# Patient Record
Sex: Female | Born: 1995 | Race: Black or African American | Hispanic: No | Marital: Single
Health system: Southern US, Community
[De-identification: ages and names within clinical notes are randomized; demographics above are authoritative.]

## PROBLEM LIST (undated history)

## (undated) ENCOUNTER — Emergency Department (HOSPITAL_COMMUNITY): Admission: EM | Payer: Self-pay | Source: Home / Self Care

## (undated) DIAGNOSIS — B009 Herpesviral infection, unspecified: Secondary | ICD-10-CM

## (undated) DIAGNOSIS — A549 Gonococcal infection, unspecified: Secondary | ICD-10-CM

## (undated) HISTORY — DX: Herpesviral infection, unspecified: B00.9

## (undated) HISTORY — DX: Gonococcal infection, unspecified: A54.9

---
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
## 2017-03-12 ENCOUNTER — Emergency Department (HOSPITAL_COMMUNITY): Payer: Medicaid Other

## 2017-03-12 ENCOUNTER — Encounter (HOSPITAL_COMMUNITY): Payer: Self-pay | Admitting: Emergency Medicine

## 2017-03-12 ENCOUNTER — Emergency Department (HOSPITAL_COMMUNITY)
Admission: EM | Admit: 2017-03-12 | Discharge: 2017-03-12 | Disposition: A | Discharge status: Home / Self Care | Payer: Medicaid Other | Attending: Emergency Medicine | Admitting: Emergency Medicine

## 2017-03-12 DIAGNOSIS — R918 Other nonspecific abnormal finding of lung field: Secondary | ICD-10-CM | POA: Insufficient documentation

## 2017-03-12 DIAGNOSIS — R079 Chest pain, unspecified: Secondary | ICD-10-CM | POA: Diagnosis present

## 2017-03-12 DIAGNOSIS — R059 Cough, unspecified: Secondary | ICD-10-CM

## 2017-03-12 DIAGNOSIS — R05 Cough: Secondary | ICD-10-CM

## 2017-03-12 DIAGNOSIS — R0789 Other chest pain: Secondary | ICD-10-CM | POA: Diagnosis not present

## 2017-03-12 DIAGNOSIS — R9389 Abnormal findings on diagnostic imaging of other specified body structures: Secondary | ICD-10-CM

## 2017-03-12 MED ORDER — BENZONATATE 100 MG PO CAPS: 100.0000 mg | ORAL_CAPSULE | Freq: Three times a day (TID) | ORAL | 0 refills | Status: AC | PRN

## 2017-03-12 MED ORDER — IBUPROFEN 400 MG PO TABS
600.0000 mg | ORAL_TABLET | Freq: Once | ORAL | Status: AC
  Administered 2017-03-12: 600 mg via ORAL
  Filled 2017-03-12: qty 1

## 2017-03-12 NOTE — Discharge Instructions (Signed)
You have a chest x-ray with a nodule over your upper left chest.  Please follow up in three months for a repeat CXR or additional testing as deemed necessary by your primary care provider.  Please call the number listed in this paperwork to obtain a primary care provider.

## 2017-03-12 NOTE — ED Provider Notes (Signed)
[facility]EMERGENCY DEPT Provider Note   CSN: [id number] Arrival date & time: 03/12/17  0807     History   Chief Complaint Chief Complaint  Patient presents with  . Chest Pain    HPI Deborah Duncan is a 21 y.o. female. She presents with approximately 2 weeks of worsening cough, congestion and for the past few days has been experiencing right sided lower anterior "rib pain".  She reports that her pain is currently a 10 out of 10, increases with cough, deep breathing. Reports that she has been occasionally taking ibuprofen with her last dose this week and, she reports that taking 200 MG ibuprofen does not help with her pain.  She reports occasional headache, one episode of diarrhea this morning.  Denies history of asthma, shortness of breath, nausea, vomiting.    HPI  Past Medical History:  Diagnosis Date  . Gonorrhea   . HSV-2 (herpes simplex virus 2) infection     There are no active problems to display for this patient.   History reviewed. No pertinent surgical history.  OB History    No data available       Home Medications    Prior to Admission medications   Medication Sig Start Date End Date Taking? Authorizing Provider  benzonatate (TESSALON) 100 MG capsule Take 1 capsule (100 mg total) by mouth 3 (three) times daily as needed for cough. 03/12/17 03/22/17  Nira Conn, MD  diphenhydrAMINE (BENADRYL) 25 MG tablet Take 1 tablet (25 mg total) by mouth every 4 (four) hours as needed for itching. 05/20/15   Zadie Rhine, MD  EPINEPHrine (EPIPEN 2-PAK) 0.3 mg/0.3 mL IJ SOAJ injection Inject 0.3 mLs (0.3 mg total) into the muscle once. Patient not taking: Reported on 05/23/2015 05/20/15   Zadie Rhine, MD  medroxyPROGESTERone (DEPO-PROVERA) 150 MG/ML injection Inject 150 mg into the muscle every 3 (three) months.    Historical Provider, MD    Family History Family History  Problem Relation Age of Onset  . Hypertension Mother     Social History Social History    Substance Use Topics  . Smoking status: Never Smoker  . Smokeless tobacco: Never Used  . Alcohol use No     Allergies   Acyclovir and related   Review of Systems Review of Systems  Constitutional: Negative for chills, fatigue and fever.  HENT: Positive for sore throat. Negative for ear pain, trouble swallowing and voice change.   Eyes: Negative for pain and visual disturbance.  Respiratory: Positive for cough. Negative for chest tightness and shortness of breath.   Cardiovascular: Negative for chest pain and palpitations.  Gastrointestinal: Positive for diarrhea (One episode this morning). Negative for abdominal pain, nausea and vomiting.  Genitourinary: Negative for dysuria and hematuria.  Musculoskeletal: Positive for arthralgias and myalgias. Negative for back pain.  Skin: Negative for color change and rash.  Neurological: Negative for dizziness, seizures and syncope.  All other systems reviewed and are negative.    Physical Exam Updated Vital Signs BP 119/78   Pulse 86   Temp 98.2 F (36.8 C)   Resp 18   SpO2 98%   Physical Exam  Constitutional: She appears well-developed and well-nourished. No distress.  HENT:  Head: Normocephalic and atraumatic.  Right Ear: External ear normal.  Left Ear: External ear normal.  Mouth/Throat: Oropharynx is clear and moist. No oropharyngeal exudate.  Eyes: Conjunctivae are normal. Right eye exhibits no discharge. Left eye exhibits no discharge. No scleral icterus.  Neck: Normal range of  motion. Neck supple. No tracheal deviation present.  Cardiovascular: Normal rate, regular rhythm and normal heart sounds.   Pulmonary/Chest: Effort normal and breath sounds normal. No stridor. No respiratory distress. She has no wheezes. She has no rales. She exhibits tenderness (Right anterior lower ribs). She exhibits no mass and no deformity. Right breast exhibits no inverted nipple, no mass, no nipple discharge, no skin change and no tenderness.  Left breast exhibits no inverted nipple, no mass, no nipple discharge, no skin change and no tenderness. Breasts are symmetrical.    Skin is with out nevus consistent with nodule on CXR  Abdominal: Soft. She exhibits no distension.  Musculoskeletal: She exhibits no edema or deformity.  Lymphadenopathy:    She has no cervical adenopathy.  Neurological: She is alert. She exhibits normal muscle tone.  Skin: Skin is warm and dry. She is not diaphoretic.  Psychiatric: She has a normal mood and affect. Her behavior is normal.  Nursing note and vitals reviewed.    ED Treatments / Results  Labs (all labs ordered are listed, but only abnormal results are displayed) Labs Reviewed - No data to display  EKG  EKG Interpretation None       Radiology Dg Chest 2 View  Result Date: 03/12/2017 CLINICAL DATA:  Cough and chest pain EXAM: CHEST  2 VIEW COMPARISON:  None. FINDINGS: There is a nodular opacity either in or overlying the left upper lobe measuring 1.4 x 1.3 cm. Lungs elsewhere clear. Heart size and pulmonary vascularity are normal. No adenopathy. No bone lesions. No pneumothorax. IMPRESSION: Nodular opacity either in or overlying the left upper lobe. Advise clinical assessment for possible skin nevus as the cause of this opacity. If an overlying structure is not seen, it would be prudent to obtain a follow-up chest radiograph in 3 months to assess for stability. Alternatively, noncontrast enhanced chest CT could be helpful for further assessment in this regard. Lungs elsewhere clear. No adenopathy. Electronically Signed   By: Bretta Bang III M.D.   On: 03/12/2017 09:11    Procedures Procedures (including critical care time)  Medications Ordered in ED Medications  ibuprofen (ADVIL,MOTRIN) tablet 600 mg (600 mg Oral Given 03/12/17 0915)     Initial Impression / Assessment and Plan / ED Course  I have reviewed the triage vital signs and the nursing notes.  Pertinent labs & imaging  results that were available during my care of the patient were reviewed by me and considered in my medical decision making (see chart for details).    Pt CXR negative for acute infiltrate. Based on nodule found on CXR a complete breast exam was performed, with chaperone, that was unremarkable.  Patients symptoms are consistent with URI, likely viral etiology. Discussed that antibiotics are not indicated for viral infections. Pt will be discharged with symptomatic treatment and tessalon for cough.  Verbalizes understanding and is agreeable with plan. Pt is hemodynamically stable & in NAD prior to dc.  Patient was informed of the incidental finding of nodule on CXR.  Patient was advised to Obtain a primary care doctor.  Patient was advised of radiologist's recommendations and to obtain a repeat chest x-ray in 3 months or as directed by her primary care.  Patient was given the opportunity to ask questions all of which were answered to her satisfaction.    Final Clinical Impressions(s) / ED Diagnoses   Final diagnoses:  Chest wall pain  Abnormal finding on chest xray  Cough    New Prescriptions  Discharge Medication List as of 03/12/2017 10:20 AM    START taking these medications   Details  benzonatate (TESSALON) 100 MG capsule Take 1 capsule (100 mg total) by mouth 3 (three) times daily as needed for cough., Starting Tue 03/12/2017, Until Fri 03/22/2017, Print         Cristina Gong, PA-C 03/12/17 1104    Canary Brim Tegeler, MD 03/12/17 [phone]

## 2017-03-12 NOTE — ED Notes (Signed)
Patient returned from xray.

## 2017-03-12 NOTE — ED Provider Notes (Signed)
Medical screening examination/treatment/procedure(s) were conducted as a shared visit with non-physician practitioner(s) and myself.  I personally evaluated the patient during the encounter. Briefly, the patient is a 21 y.o. female  with no significant past medical history presents with 2 weeks of URI symptoms. No fevers. Lungs clear auscultation bilaterally. Chest x-ray without evidence of pneumonia. We'll treat symptomatically with Tessalon Perles. Safe for discharge with strict return precautions.   EKG Interpretation None           Nira Conn, MD 03/12/17 1721

## 2017-03-12 NOTE — ED Notes (Signed)
c/o pain under her right rib area, states her allergies are really bad. c/o occ cough. Pain is worse with movement and inspiration.

## 2017-03-12 NOTE — ED Triage Notes (Signed)
Pt reports hx of seasonal allergies and coughing, c/o right rib pain since last weekened. Denies any injury, resp e/u.

## 2018-09-01 ENCOUNTER — Ambulatory Visit (HOSPITAL_COMMUNITY): Admission: EM | Admit: 2018-09-01 | Discharge: 2018-09-01 | Discharge status: Against Medical Advice | Payer: Self-pay

## 2021-05-07 ENCOUNTER — Encounter (HOSPITAL_COMMUNITY): Payer: Self-pay

## 2021-05-07 ENCOUNTER — Emergency Department (HOSPITAL_COMMUNITY)
Admission: EM | Admit: 2021-05-07 | Discharge: 2021-05-07 | Disposition: A | Discharge status: Home / Self Care | Payer: Self-pay | Attending: Emergency Medicine | Admitting: Emergency Medicine

## 2021-05-07 ENCOUNTER — Other Ambulatory Visit: Payer: Self-pay

## 2021-05-07 DIAGNOSIS — B9689 Other specified bacterial agents as the cause of diseases classified elsewhere: Secondary | ICD-10-CM | POA: Insufficient documentation

## 2021-05-07 DIAGNOSIS — N760 Acute vaginitis: Secondary | ICD-10-CM | POA: Insufficient documentation

## 2021-05-07 LAB — WET PREP, GENITAL
Sperm: NONE SEEN
Trich, Wet Prep: NONE SEEN
Yeast Wet Prep HPF POC: NONE SEEN

## 2021-05-07 LAB — HIV ANTIBODY (ROUTINE TESTING W REFLEX): HIV Screen 4th Generation wRfx: NONREACTIVE

## 2021-05-07 MED ORDER — METRONIDAZOLE 500 MG PO TABS: 500.0000 mg | ORAL_TABLET | Freq: Two times a day (BID) | ORAL | 0 refills | Status: DC

## 2021-05-07 NOTE — ED Provider Notes (Signed)
[facility]-EMERGENCY DEPT Provider Note   CSN: [id number] Arrival date & time: 05/07/21  1208     History No chief complaint on file.   Deborah Duncan is a 25 y.o. female.  The history is provided by the patient. No language interpreter was used.   25 year old female significant history of herpes infection, gonorrhea, who presents with concern for abnormal vaginal changes.  Patient states this morning sexual partner noticed some abnormal changes to her vaginal region and she would like to be evaluated.  She denies any abdominal pain vaginal pain vaginal bleeding vaginal discharge or any irritation.  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  No fever or dysuria.  Past Medical History:  Diagnosis Date   Gonorrhea    HSV-2 (herpes simplex virus 2) infection     There are no problems to display for this patient.   History reviewed. No pertinent surgical history.   OB History   No obstetric history on file.     Family History  Problem Relation Age of Onset   Hypertension Mother     Social History   Tobacco Use   Smoking status: Never   Smokeless tobacco: Never  Vaping Use   Vaping Use: Never used  Substance Use Topics   Alcohol use: No   Drug use: No    Home Medications Prior to Admission medications   Medication Sig Start Date End Date Taking? Authorizing Provider  diphenhydrAMINE (BENADRYL) 25 MG tablet Take 1 tablet (25 mg total) by mouth every 4 (four) hours as needed for itching. 05/20/15   Zadie Rhine, MD  EPINEPHrine (EPIPEN 2-PAK) 0.3 mg/0.3 mL IJ SOAJ injection Inject 0.3 mLs (0.3 mg total) into the muscle once. Patient not taking: Reported on 05/23/2015 05/20/15   Zadie Rhine, MD  medroxyPROGESTERone (DEPO-PROVERA) 150 MG/ML injection Inject 150 mg into the muscle every 3 (three) months.    [provider]    Allergies    Acyclovir and related  Review of Systems    Review of Systems  All other systems reviewed and are negative.  Physical Exam Updated Vital Signs BP 135/78 (BP Location: Left Arm)   Pulse 84   Temp 99 F (37.2 C) (Oral)   Resp 18   Ht 5\' 9"  (1.753 m)   Wt 61.6 kg   LMP 04/06/2021 (Approximate)   SpO2 100%   BMI 20.04 kg/m   Physical Exam Vitals and nursing note reviewed.  Constitutional:      General: She is not in acute distress.    Appearance: She is well-developed.  HENT:     Head: Atraumatic.  Eyes:     Conjunctiva/sclera: Conjunctivae normal.  Pulmonary:     Effort: Pulmonary effort is normal.  Abdominal:     Palpations: Abdomen is soft.     Tenderness: There is no abdominal tenderness.  Musculoskeletal:     Cervical back: Neck supple.  Skin:    Findings: No rash.  Neurological:     Mental Status: She is alert.  Psychiatric:        Mood and Affect: Mood normal.    ED Results / Procedures / Treatments   Labs (all labs ordered are listed, but only abnormal results are displayed) Labs Reviewed  WET PREP, GENITAL - Abnormal; Notable for the following components:      Result Value   Clue Cells Wet Prep HPF POC PRESENT (*)    WBC, Wet Prep  HPF POC MODERATE (*)    All other components within normal limits  RPR  URINALYSIS, ROUTINE W REFLEX MICROSCOPIC  HIV ANTIBODY (ROUTINE TESTING W REFLEX)  GC/CHLAMYDIA PROBE AMP ([place]) NOT AT [facility]    EKG None  Radiology No results found.  Procedures Pelvic exam  Date/Time: 05/07/2021 1:47 PM Performed by: Fayrene Helper, PA-C Authorized by: Fayrene Helper, PA-C  Consent: Verbal consent obtained. Risks and benefits: risks, benefits and alternatives were discussed Consent given by: patient Patient understanding: patient states understanding of the procedure being performed Patient consent: the patient's understanding of the procedure matches consent given Required items: required blood products, implants, devices, and special equipment available Patient  identity confirmed: verbally with patient Comments: Chaperone present during exam.  No inguinal lymphadenopathy or inguinal hernia noted.  Small micropapular lesion noted to left labia without any blister and nontender to palpation.  Mild discomfort with speculum insertion.  Moderate vaginal discharge noted in vaginal vault with trace of blood.  No obvious vesicular lesion noted.  Close cervical os free of lesion or rash.  On bimanual examination no adnexal tenderness or cervical motion tenderness     Medications Ordered in ED Medications - No data to display  ED Course  I have reviewed the triage vital signs and the nursing notes.  Pertinent labs & imaging results that were available during my care of the patient were reviewed by me and considered in my medical decision making (see chart for details).    MDM Rules/Calculators/A&P                          BP 101/67   Pulse 67   Temp 99 F (37.2 C) (Oral)   Resp 16   Ht 5\' 9"  (1.753 m)   Wt 61.6 kg   LMP 04/06/2021 (Approximate)   SpO2 96%   BMI 20.04 kg/m   Final Clinical Impression(s) / ED Diagnoses Final diagnoses:  BV (bacterial vaginosis)    Rx / DC Orders ED Discharge Orders          Ordered    metroNIDAZOLE (FLAGYL) 500 MG tablet  2 times daily        05/07/21 1539           1:31 PM Patient's sexual partner noted some skin changes to her vaginal region and patient is here to evaluate for that.  She does exhibit risky sexual practices with multiple partners.  Will perform STI screening  1:48 PM No obvious herpetic lesions noted on pelvic exam.  Patient does have some mild vaginal discomfort on bimanual examination but declined prophylactic antibiotic for STI.  3:37 PM However shows evidence of clue cells as well as moderate in WBC.  Since patient does have vaginal discharge and odor, will prescribe Flagyl as treatment for BV.  Patient made aware to follow-up on the remaining of her labs through MyChart for  additional treatment if needed.  Otherwise she is stable for discharge.     05/09/21, PA-C 05/07/21 1541    05/09/21, MD 05/09/21 1053

## 2021-05-07 NOTE — ED Triage Notes (Addendum)
Patient states she was with her significant other today and they told her that she had vaginal irritation. Patient states that she does not feel like anything is irritated, but wants to be seen.  Patient added that she is having lower abdominal pain since this AM

## 2021-05-07 NOTE — Discharge Instructions (Addendum)
You have been diagnosed with bacterial vaginosis, please take antibiotic as prescribed and avoid drinking alcohol while taking antibiotic as it can make you sick.  Check MyChart for the remainder of the labs.  Return if you have any concern.

## 2021-05-08 LAB — GC/CHLAMYDIA PROBE AMP (~~LOC~~) NOT AT ARMC
Chlamydia: POSITIVE — AB
Comment: NEGATIVE
Comment: NORMAL
Neisseria Gonorrhea: POSITIVE — AB

## 2021-05-08 LAB — RPR: RPR Ser Ql: NONREACTIVE

## 2021-05-10 ENCOUNTER — Encounter (HOSPITAL_COMMUNITY): Payer: Self-pay | Admitting: Emergency Medicine

## 2021-05-10 ENCOUNTER — Emergency Department (HOSPITAL_COMMUNITY)
Admission: EM | Admit: 2021-05-10 | Discharge: 2021-05-10 | Disposition: A | Discharge status: Home / Self Care | Payer: Self-pay | Attending: Physician Assistant | Admitting: Physician Assistant

## 2021-05-10 ENCOUNTER — Emergency Department (HOSPITAL_COMMUNITY): Admission: EM | Admit: 2021-05-10 | Discharge: 2021-05-10 | Discharge status: Absent without leave | Payer: Self-pay

## 2021-05-10 DIAGNOSIS — Z760 Encounter for issue of repeat prescription: Secondary | ICD-10-CM | POA: Insufficient documentation

## 2021-05-10 DIAGNOSIS — A64 Unspecified sexually transmitted disease: Secondary | ICD-10-CM

## 2021-05-10 MED ORDER — METRONIDAZOLE 500 MG PO TABS: 500.0000 mg | ORAL_TABLET | Freq: Two times a day (BID) | ORAL | 0 refills | Status: AC

## 2021-05-10 MED ORDER — STERILE WATER FOR INJECTION IJ SOLN
INTRAMUSCULAR | Status: AC
  Administered 2021-05-10: 2.1 mL
  Filled 2021-05-10: qty 10

## 2021-05-10 MED ORDER — DOXYCYCLINE HYCLATE 100 MG PO CAPS: 100.0000 mg | ORAL_CAPSULE | Freq: Two times a day (BID) | ORAL | 0 refills | Status: AC

## 2021-05-10 MED ORDER — CEFTRIAXONE SODIUM 1 G IJ SOLR
500.0000 mg | Freq: Once | INTRAMUSCULAR | Status: AC
  Administered 2021-05-10: 500 mg via INTRAMUSCULAR
  Filled 2021-05-10: qty 10

## 2021-05-10 NOTE — ED Triage Notes (Signed)
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

## 2021-05-10 NOTE — ED Provider Notes (Signed)
[facility]-EMERGENCY DEPT Provider Note   CSN: [id number] Arrival date & time: 05/10/21  1209     History Chief Complaint  Patient presents with   Medication Refill    Deborah Duncan is a 24 y.o. female.  HPI 25 year old female presents to the ER with request to send her Flagyl to her pharmacy in [place].  She states she was seen here several days ago and had the Flagyl sent to pharmacy in [place].  I did review her chart and her gonorrhea and Chlamydia test are also positive.  Patient states she was not contacted by pharmacy.  Denies any abdominal pain, nausea, vomiting, fevers or chills.  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    Past Medical History:  Diagnosis Date   Gonorrhea    HSV-2 (herpes simplex virus 2) infection     There are no problems to display for this patient.   History reviewed. No pertinent surgical history.   OB History   No obstetric history on file.     Family History  Problem Relation Age of Onset   Hypertension Mother     Social History   Tobacco Use   Smoking status: Never   Smokeless tobacco: Never  Vaping Use   Vaping Use: Never used  Substance Use Topics   Alcohol use: No   Drug use: No    Home Medications Prior to Admission medications   Medication Sig Start Date End Date Taking? Authorizing Provider  doxycycline (VIBRAMYCIN) 100 MG capsule Take 1 capsule (100 mg total) by mouth 2 (two) times daily for 7 days. 05/10/21 05/17/21 Yes Mare Ferrari, PA-C  diphenhydrAMINE (BENADRYL) 25 MG tablet Take 1 tablet (25 mg total) by mouth every 4 (four) hours as needed for itching. 05/20/15   Zadie Rhine, MD  EPINEPHrine (EPIPEN 2-PAK) 0.3 mg/0.3 mL IJ SOAJ injection Inject 0.3 mLs (0.3 mg total) into the muscle once. Patient not taking: Reported on 05/23/2015 05/20/15   Zadie Rhine, MD  medroxyPROGESTERone (DEPO-PROVERA) 150 MG/ML injection Inject 150 mg into the muscle every 3  (three) months.    [provider]  metroNIDAZOLE (FLAGYL) 500 MG tablet Take 1 tablet (500 mg total) by mouth 2 (two) times daily. One po bid x 7 days 05/10/21   Mare Ferrari, PA-C    Allergies    Acyclovir and related  Review of Systems   Review of Systems Ten systems reviewed and are negative for acute change, except as noted in the HPI.   Physical Exam Updated Vital Signs BP 123/79 (BP Location: Left Arm)   Pulse 74   Temp 98.3 F (36.8 C) (Oral)   Resp 18   SpO2 98%   Physical Exam Vitals reviewed.  Constitutional:      Appearance: Normal appearance.  HENT:     Head: Normocephalic and atraumatic.  Eyes:     General:        Right eye: No discharge.        Left eye: No discharge.     Extraocular Movements: Extraocular movements intact.     Conjunctiva/sclera: Conjunctivae normal.  Abdominal:     Tenderness: There is no abdominal tenderness.  Musculoskeletal:        General: No swelling. Normal range of motion.  Neurological:     General: No focal deficit present.     Mental Status: She is alert and oriented to person, place, and time.  Psychiatric:  Mood and Affect: Mood normal.        Behavior: Behavior normal.    ED Results / Procedures / Treatments   Labs (all labs ordered are listed, but only abnormal results are displayed) Labs Reviewed - No data to display  EKG None  Radiology No results found.  Procedures Procedures   Medications Ordered in ED Medications  cefTRIAXone (ROCEPHIN) injection 500 mg (has no administration in time range)    ED Course  I have reviewed the triage vital signs and the nursing notes.  Pertinent labs & imaging results that were available during my care of the patient were reviewed by me and considered in my medical decision making (see chart for details).    MDM Rules/Calculators/A&P                          25 year old female here asking to have her Flagyl sent to a different pharmacy.  Tested  positive for BV 3 days ago, I did review her test results and she is also positive for gonorrhea and chlamydia.  We will treat her here with Rocephin, will call in Doxy and Flagyl to a pharmacy in here in [place].  Patient encouraged to abstain from sex for 2 weeks, have all partners tested and treated.  We will give health department contact info.  Patient denies any abdominal pain, fevers or chills.  Low suspicion for PID, TOA.  We discussed return precautions.  She voiced her understanding and is agreeable.  Stable for discharge. Final Clinical Impression(s) / ED Diagnoses Final diagnoses:  None    Rx / DC Orders ED Discharge Orders          Ordered    metroNIDAZOLE (FLAGYL) 500 MG tablet  2 times daily        05/10/21 1245    doxycycline (VIBRAMYCIN) 100 MG capsule  2 times daily        05/10/21 1245             Leone Brand 05/10/21 1251    Alvira Monday, MD 05/11/21 [phone]

## 2021-05-10 NOTE — ED Notes (Signed)
Called for patient in lobby for [facility]. Patient did not answer. Walked out side and called for patient. No answer. Patient was seen leaving by staff.

## 2021-05-10 NOTE — Discharge Instructions (Addendum)
Do not have sex for 2 weeks Have all partners tested and treated Take both antibiotics as directed until finished.  Do not drink alcohol while taking Flagyl as it will make you sick. Practice safe sex and use a condom to prevent infection or unwanted pregnancy Follow up with the Health Department

## 2021-05-10 NOTE — ED Notes (Signed)
Patient verbalized understanding of discharge instructions.  ?

## 2022-02-14 ENCOUNTER — Emergency Department (HOSPITAL_COMMUNITY): Payer: Self-pay

## 2022-02-14 ENCOUNTER — Encounter (HOSPITAL_COMMUNITY): Payer: Self-pay

## 2022-02-14 ENCOUNTER — Emergency Department (HOSPITAL_COMMUNITY)
Admission: EM | Admit: 2022-02-14 | Discharge: 2022-02-14 | Disposition: A | Discharge status: Home / Self Care | Payer: Self-pay | Attending: Emergency Medicine | Admitting: Emergency Medicine

## 2022-02-14 DIAGNOSIS — S60512A Abrasion of left hand, initial encounter: Secondary | ICD-10-CM | POA: Insufficient documentation

## 2022-02-14 DIAGNOSIS — S60413A Abrasion of left middle finger, initial encounter: Secondary | ICD-10-CM | POA: Insufficient documentation

## 2022-02-14 DIAGNOSIS — S6992XA Unspecified injury of left wrist, hand and finger(s), initial encounter: Secondary | ICD-10-CM

## 2022-02-14 DIAGNOSIS — W228XXA Striking against or struck by other objects, initial encounter: Secondary | ICD-10-CM | POA: Insufficient documentation

## 2022-02-14 MED ORDER — LIDOCAINE HCL 2 % IJ SOLN
10.0000 mL | Freq: Once | INTRAMUSCULAR | Status: AC
  Administered 2022-02-14: 200 mg
  Filled 2022-02-14: qty 20
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
## 2022-02-14 NOTE — Discharge Instructions (Signed)
Please keep area clean and dry.  Neosporin and Band-Aids if you are going to be outside.  Please return to the emergency department for any worsening symptoms. ?

## 2022-12-29 ENCOUNTER — Observation Stay (HOSPITAL_COMMUNITY): Payer: Self-pay

## 2022-12-29 ENCOUNTER — Encounter (HOSPITAL_COMMUNITY): Payer: Self-pay

## 2022-12-29 ENCOUNTER — Observation Stay (HOSPITAL_COMMUNITY)
Admission: EM | Admit: 2022-12-29 | Discharge: 2022-12-31 | Disposition: A | Discharge status: Home / Self Care | Payer: Self-pay | Attending: Surgery | Admitting: Surgery

## 2022-12-29 ENCOUNTER — Other Ambulatory Visit: Payer: Self-pay

## 2022-12-29 DIAGNOSIS — S42309A Unspecified fracture of shaft of humerus, unspecified arm, initial encounter for closed fracture: Secondary | ICD-10-CM | POA: Diagnosis present

## 2022-12-29 DIAGNOSIS — W3400XA Accidental discharge from unspecified firearms or gun, initial encounter: Secondary | ICD-10-CM

## 2022-12-29 DIAGNOSIS — S42352A Displaced comminuted fracture of shaft of humerus, left arm, initial encounter for closed fracture: Secondary | ICD-10-CM

## 2022-12-29 DIAGNOSIS — Y92410 Unspecified street and highway as the place of occurrence of the external cause: Secondary | ICD-10-CM | POA: Insufficient documentation

## 2022-12-29 LAB — CREATININE, SERUM
Creatinine, Ser: 0.85 mg/dL (ref 0.44–1.00)
GFR, Estimated: >60 mL/min (ref >60)

## 2022-12-29 LAB — CBC
HCT: 40.1 % (ref 36.0–46.0)
Hemoglobin: 13.2 g/dL (ref 12.0–15.0)
MCH: 29.5 pg (ref 26.0–34.0)
MCHC: 32.9 g/dL (ref 30.0–36.0)
MCV: 89.5 fL (ref 80.0–100.0)
Platelets: 261 10*3/uL (ref 150–400)
RBC: 4.48 MIL/uL (ref 3.87–5.11)
RDW: 13.5 % (ref 11.5–15.5)
WBC: 7.1 10*3/uL (ref 4.0–10.5)
nRBC: 0 % (ref 0.0–0.2)

## 2022-12-29 LAB — I-STAT CHEM 8, ED
BUN: 13 mg/dL (ref 6–20)
Calcium, Ion: 1.07 mmol/L — ABNORMAL LOW (ref 1.15–1.40)
Chloride: 106 mmol/L (ref 98–111)
Creatinine, Ser: 0.7 mg/dL (ref 0.44–1.00)
Glucose, Bld: 117 mg/dL — ABNORMAL HIGH (ref 70–99)
HCT: 39 % (ref 36.0–46.0)
Hemoglobin: 13.3 g/dL (ref 12.0–15.0)
Potassium: 3.3 mmol/L — ABNORMAL LOW (ref 3.5–5.1)
Sodium: 141 mmol/L (ref 135–145)
TCO2: 22 mmol/L (ref 22–32)

## 2022-12-29 LAB — HIV ANTIBODY (ROUTINE TESTING W REFLEX): HIV Screen 4th Generation wRfx: NONREACTIVE

## 2022-12-29 LAB — HCG, QUANTITATIVE, PREGNANCY: hCG, Beta Chain, Quant, S: <1 m[IU]/mL (ref <5)

## 2022-12-29 MED ORDER — ONDANSETRON 4 MG PO TBDP
4.0000 mg | ORAL_TABLET | Freq: Four times a day (QID) | ORAL | Status: DC | PRN
  Administered 2022-12-30 – 2022-12-31 (×4): 4 mg via ORAL
  Filled 2022-12-29 (×4): qty 1

## 2022-12-29 MED ORDER — ENOXAPARIN SODIUM 30 MG/0.3ML IJ SOSY
30.0000 mg | PREFILLED_SYRINGE | Freq: Two times a day (BID) | INTRAMUSCULAR | Status: DC
  Filled 2022-12-29 (×2): qty 0.3

## 2022-12-29 MED ORDER — METHOCARBAMOL 1000 MG/10ML IJ SOLN
500.0000 mg | Freq: Four times a day (QID) | INTRAVENOUS | Status: DC | PRN
  Administered 2022-12-30: 500 mg via INTRAVENOUS
  Filled 2022-12-29: qty 500

## 2022-12-29 MED ORDER — ONDANSETRON HCL 4 MG/2ML IJ SOLN
4.0000 mg | Freq: Four times a day (QID) | INTRAMUSCULAR | Status: DC | PRN
  Administered 2022-12-29 – 2022-12-30 (×2): 4 mg via INTRAVENOUS
  Filled 2022-12-29 (×2): qty 2

## 2022-12-29 MED ORDER — DOCUSATE SODIUM 100 MG PO CAPS
100.0000 mg | ORAL_CAPSULE | Freq: Two times a day (BID) | ORAL | Status: DC
  Filled 2022-12-29 (×4): qty 1

## 2022-12-29 MED ORDER — MORPHINE SULFATE (PF) 2 MG/ML IV SOLN
2.0000 mg | INTRAVENOUS | Status: DC | PRN
  Administered 2022-12-29 (×2): 2 mg via INTRAVENOUS
  Administered 2022-12-29: 4 mg via INTRAVENOUS
  Administered 2022-12-29: 2 mg via INTRAVENOUS
  Administered 2022-12-29 (×2): 4 mg via INTRAVENOUS
  Administered 2022-12-30: 2 mg via INTRAVENOUS
  Administered 2022-12-30: 4 mg via INTRAVENOUS
  Administered 2022-12-30: 2 mg via INTRAVENOUS
  Administered 2022-12-31 (×2): 4 mg via INTRAVENOUS
  Filled 2022-12-29 (×2): qty 1
  Filled 2022-12-29 (×4): qty 2
  Filled 2022-12-29 (×2): qty 1
  Filled 2022-12-29 (×3): qty 2

## 2022-12-29 MED ORDER — CLINDAMYCIN PHOSPHATE 900 MG/50ML IV SOLN
900.0000 mg | Freq: Three times a day (TID) | INTRAVENOUS | Status: AC
  Administered 2022-12-29 (×3): 900 mg via INTRAVENOUS
  Filled 2022-12-29 (×4): qty 50

## 2022-12-29 MED ORDER — FENTANYL CITRATE (PF) 100 MCG/2ML IJ SOLN
INTRAMUSCULAR | Status: AC
  Administered 2022-12-29: 50 ug
  Filled 2022-12-29: qty 2

## 2022-12-29 MED ORDER — OXYCODONE HCL 5 MG PO TABS
5.0000 mg | ORAL_TABLET | ORAL | Status: DC | PRN
  Administered 2022-12-29 – 2022-12-30 (×6): 5 mg via ORAL
  Filled 2022-12-29 (×7): qty 1

## 2022-12-29 MED ORDER — ACETAMINOPHEN 325 MG PO TABS: 650.0000 mg | ORAL_TABLET | ORAL | Status: DC | PRN
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
## 2022-12-29 NOTE — Progress Notes (Signed)
Patient bleeding from her wound in her breast, did dressing with xeroform and gauze.
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
## 2022-12-29 NOTE — Plan of Care (Signed)
  Problem: Activity: Goal: Risk for activity intolerance will decrease Outcome: Progressing   Problem: Coping: Goal: Level of anxiety will decrease Outcome: Progressing   Problem: Pain Managment: Goal: General experience of comfort will improve Outcome: Progressing   
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
## 2022-12-29 NOTE — ED Notes (Signed)
Portable xray at the bedside for films

## 2022-12-29 NOTE — ED Notes (Signed)
Pts left upper arm was cleansed and re-wrapped with nonadherent dressing and kerlex after ortho tech advised when applying splint bandage had bleed through.
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
## 2022-12-29 NOTE — Progress Notes (Signed)
Orthopedic Tech Progress Note Patient Details:  Deborah Duncan 1996-03-06 [id number]  Ortho Devices Type of Ortho Device: Arm sling Ortho Device/Splint Location: lue. delivered to room for patient to wear when out of bed. Ortho Device/Splint Interventions: Ordered, Application, Adjustment  RN is getting an order for arm sling Post Interventions Patient Tolerated: Well Instructions Provided: Care of device, Adjustment of device  Karolee Stamps 12/29/2022, 9:41 PM

## 2022-12-29 NOTE — Progress Notes (Signed)
Orthopedic Tech Progress Note Patient Details:  Deborah Duncan 07-20-1996 [id number]  Well-padded coaptation splint applied to LUE. [name], RN, had placed a bulky gauze dressing with kerlix lightly wrapped around the wound on her L arm which I placed the splint over. There was plenty of give in the kerlix to allow for swelling without constricting her arm. I also placed an ice pack to her LUE once splinting was completed. Motion and sensation of her fingers remained intact.   Ortho Devices Type of Ortho Device: Coapt Ortho Device/Splint Location: LUE Ortho Device/Splint Interventions: Ordered, Application, Adjustment   Post Interventions Patient Tolerated: Fair Instructions Provided: Care of device  [name] [name] 12/29/2022, 9:32 AM

## 2022-12-29 NOTE — Progress Notes (Signed)
Please contact mother Arrie Aran at [phone] and/or father Hollice Espy at [phone] with any updates or if needed.

## 2022-12-29 NOTE — TOC CAGE-AID Note (Signed)
Transition of Care [facility]) - CAGE-AID Screening   Patient Details  Name: Deborah Duncan MRN: [id number] Date of Birth: 1996-10-05   Elvina Sidle, RN Trauma Response Nurse Phone Number: [phone] 12/29/2022, 9:27 AM      CAGE-AID Screening:    Have You Ever Felt You Ought to Cut Down on Your Drinking or Drug Use?: No Have People Annoyed You By Critizing Your Drinking Or Drug Use?: No Have You Felt Bad Or Guilty About Your Drinking Or Drug Use?: No Have You Ever Had a Drink or Used Drugs First Thing In The Morning to Steady Your Nerves or to Get Rid of a Hangover?: No CAGE-AID Score: 0  Substance Abuse Education Offered: No (no resources needed)
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
## 2022-12-29 NOTE — Progress Notes (Signed)
   12/29/22 0520  Spiritual Encounters  Type of Visit Initial  Care provided to: Patient;Family;Friend  Conversation partners present during encounter Nurse;Physician  Referral source Trauma page  Reason for visit Urgent spiritual support  OnCall Visit Yes  Spiritual Framework  Presenting Themes Other (comment)  Community/Connection Faith community;Family;Friend(s)  Patient Stress Factors Other (Comment)   Responded to truama unit. Sat with patient, called family, prayed with patient and family, remained with patient until family arrived, remained in consult room with parents until patient taken to room

## 2022-12-29 NOTE — Plan of Care (Signed)
  Problem: Health Behavior/Discharge Planning: Goal: Ability to manage health-related needs will improve Outcome: Progressing   Problem: Activity: Goal: Risk for activity intolerance will decrease 12/29/2022 2016 by Lynnea Ferrier, RN Outcome: Progressing 12/29/2022 0654 by Lynnea Ferrier, RN Outcome: Progressing   Problem: Coping: Goal: Level of anxiety will decrease 12/29/2022 2016 by Lynnea Ferrier, RN Outcome: Progressing 12/29/2022 0654 by Lynnea Ferrier, RN Outcome: Progressing   Problem: Pain Managment: Goal: General experience of comfort will improve Outcome: Progressing

## 2022-12-29 NOTE — Progress Notes (Signed)
Orthopedic Tech Progress Note Patient Details:  Deborah Duncan 10/10/1996 [id number]  Patient ID: Deborah Duncan, female   DOB: Aug 03, 1996, 27 y.o.   MRN: [id number] Went to pt rm to apply splint but was not able to do so because pt was bleeding through the wrap applied on her arm. I notify nurse and she was busy at the time but said she will get to it.  Edwina Barth 12/29/2022, 4:51 AM
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
## 2022-12-29 NOTE — ED Notes (Signed)
Pts left upper arm was cleansed and wrapped with kerlex and nonadherent dressing

## 2022-12-30 MED ORDER — DOCUSATE SODIUM 100 MG PO CAPS: 100.0000 mg | ORAL_CAPSULE | Freq: Two times a day (BID) | ORAL | Status: AC | PRN

## 2022-12-30 MED ORDER — OXYCODONE HCL 5 MG PO TABS: 5.0000 mg | ORAL_TABLET | ORAL | 0 refills | Status: DC | PRN

## 2022-12-30 MED ORDER — ONDANSETRON 4 MG PO TBDP: 4.0000 mg | ORAL_TABLET | Freq: Four times a day (QID) | ORAL | 0 refills | Status: DC | PRN

## 2022-12-30 MED ORDER — ACETAMINOPHEN 325 MG PO TABS: 1000.0000 mg | ORAL_TABLET | Freq: Four times a day (QID) | ORAL | Status: DC | PRN

## 2022-12-30 NOTE — Plan of Care (Signed)

## 2022-12-30 NOTE — Progress Notes (Signed)
Pt has discharge order but cannot go home until tomorrow. She has nobody to stay with overnight and do not have keys for her apartment. The apartment office is closed now because it is past working hours. She will be ready to go home tomorrow and get new key from the apartment office. I have informed Dr Alvino Blood MD via secure chat. Morene Rankins, LPN [phone] PM

## 2022-12-30 NOTE — TOC Transition Note (Addendum)
Transition of Care [facility]) - CM/SW Discharge Note   Patient Details  Name: Deborah Duncan MRN: [id number] Date of Birth: [birth year]  Transition of Care [facility]) CM/SW Contact:  Zenon Mayo, RN Phone Number: 12/30/2022, 8:25 AM   Clinical Narrative:    For dc home today no needs. Will be on oxycodone, filled at [facility].         Patient Goals and CMS Choice      Discharge Placement                         Discharge Plan and Services Additional resources added to the After Visit Summary for                                       Social Determinants of Health (SDOH) Interventions SDOH Screenings   Food Insecurity: No Food Insecurity (12/29/2022)  Housing: Low Risk  (12/29/2022)  Transportation Needs: No Transportation Needs (12/29/2022)  Utilities: Not At Risk (12/29/2022)     Readmission Risk Interventions     No data to display

## 2022-12-30 NOTE — Discharge Instructions (Signed)
Managing Your Pain Without Opioids     How will I manage my pain? The best strategy for controlling your pain after surgery is around the clock pain control with Tylenol (acetaminophen) and Motrin (ibuprofen or Advil). Alternating these medications with each other allows you to maximize your pain control. In addition to Tylenol and Motrin, you can use heating pads or ice packs on your incisions to help reduce your pain.  How will I alternate your regular strength over-the-counter pain medication? You will take a dose of pain medication every three hours. Start by taking 650 mg of Tylenol (2 pills of 325 mg) 3 hours later take 600 mg of Motrin (3 pills of 200 mg) 3 hours after taking the Motrin take 650 mg of Tylenol 3 hours after that take 600 mg of Motrin.   - 1 -  See example - if your first dose of Tylenol is at 12:00 PM   12:00 PM Tylenol 650 mg (2 pills of 325 mg)  3:00 PM Motrin 600 mg (3 pills of 200 mg)  6:00 PM Tylenol 650 mg (2 pills of 325 mg)  9:00 PM Motrin 600 mg (3 pills of 200 mg)  Continue alternating every 3 hours   We recommend that you follow this schedule around-the-clock for at least 3 days after surgery, or until you feel that it is no longer needed. Use the table on the last page of this handout to keep track of the medications you are taking. Important: Do not take more than 3000mg  of Tylenol or 3200mg  of Motrin in a 24-hour period. Do not take ibuprofen/Motrin if you have a history of bleeding stomach ulcers, severe kidney disease, &/or actively taking a blood thinner  What if I still have pain? If you have pain that is not controlled with the over-the-counter pain medications (Tylenol and Motrin or Advil) you might have what we call "breakthrough" pain. You will receive a prescription for a small amount of an opioid pain medication such as Oxycodone, Tramadol, or Tylenol with Codeine. Use these opioid pills in the first 24 hours after surgery if you  have breakthrough pain. Do not take more than 1 pill every 4-6 hours.  If you still have uncontrolled pain after using all opioid pills, don't hesitate to call our staff using the number provided. We will help make sure you are managing your pain in the best way possible, and if necessary, we can provide a prescription for additional pain medication.   Day 1    Time  Name of Medication Number of pills taken  Amount of Acetaminophen  Pain Level   Comments  AM PM       AM PM       AM PM       AM PM       AM PM       AM PM       AM PM       AM PM       Total Daily amount of Acetaminophen Do not take more than  3,000 mg per day      Day 2    Time  Name of Medication Number of pills taken  Amount of Acetaminophen  Pain Level   Comments  AM PM       AM PM       AM PM       AM PM       AM PM  AM PM       AM PM       AM PM       Total Daily amount of Acetaminophen Do not take more than  3,000 mg per day      Day 3    Time  Name of Medication Number of pills taken  Amount of Acetaminophen  Pain Level   Comments  AM PM       AM PM       AM PM       AM PM         AM PM       AM PM       AM PM       AM PM       Total Daily amount of Acetaminophen Do not take more than  3,000 mg per day      Day 4    Time  Name of Medication Number of pills taken  Amount of Acetaminophen  Pain Level   Comments  AM PM       AM PM       AM PM       AM PM       AM PM       AM PM       AM PM       AM PM       Total Daily amount of Acetaminophen Do not take more than  3,000 mg per day      Day 5    Time  Name of Medication Number of pills taken  Amount of Acetaminophen  Pain Level   Comments  AM PM       AM PM       AM PM       AM PM       AM PM       AM PM       AM PM       AM PM       Total Daily amount of Acetaminophen Do not take more than  3,000 mg per day      Day 6    Time  Name of Medication Number of pills taken  Amount of  Acetaminophen  Pain Level  Comments  AM PM       AM PM       AM PM       AM PM       AM PM       AM PM       AM PM       AM PM       Total Daily amount of Acetaminophen Do not take more than  3,000 mg per day      Day 7    Time  Name of Medication Number of pills taken  Amount of Acetaminophen  Pain Level   Comments  AM PM       AM PM       AM PM       AM PM       AM PM       AM PM       AM PM       AM PM       Total Daily amount of Acetaminophen Do not take more than  3,000 mg per day        For additional information about how and where  to safely dispose of unused opioid medications - [employer]  Disclaimer: This document contains information and/or instructional materials adapted from [facility] for the typical patient with your condition. It does not replace medical advice from your health care provider because your experience may differ from that of the typical patient. Talk to your health care provider if you have any questions about this document, your condition or your treatment plan. Adapted from [facility]
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
## 2022-12-31 ENCOUNTER — Other Ambulatory Visit (HOSPITAL_COMMUNITY): Payer: Self-pay

## 2022-12-31 MED ORDER — ACETAMINOPHEN 500 MG PO TABS
1000.0000 mg | ORAL_TABLET | Freq: Three times a day (TID) | ORAL | Status: DC
  Filled 2022-12-31: qty 2

## 2022-12-31 MED ORDER — ACETAMINOPHEN 500 MG PO TABS: 1000.0000 mg | ORAL_TABLET | Freq: Four times a day (QID) | ORAL | Status: AC | PRN

## 2022-12-31 MED ORDER — ONDANSETRON 4 MG PO TBDP
4.0000 mg | ORAL_TABLET | Freq: Four times a day (QID) | ORAL | 0 refills | Status: AC | PRN
  Filled 2022-12-31: qty 15, 4d supply, fill #0

## 2022-12-31 MED ORDER — OXYCODONE HCL 5 MG PO TABS
5.0000 mg | ORAL_TABLET | ORAL | 0 refills | Status: AC | PRN
  Filled 2022-12-31: qty 30, 3d supply, fill #0

## 2022-12-31 MED ORDER — METHOCARBAMOL 500 MG PO TABS
500.0000 mg | ORAL_TABLET | Freq: Three times a day (TID) | ORAL | 0 refills | Status: AC | PRN
  Filled 2022-12-31: qty 30, 10d supply, fill #0

## 2022-12-31 MED ORDER — METHOCARBAMOL 500 MG PO TABS
500.0000 mg | ORAL_TABLET | Freq: Three times a day (TID) | ORAL | Status: DC
  Administered 2022-12-31: 500 mg via ORAL
  Filled 2022-12-31: qty 1

## 2022-12-31 MED ORDER — GABAPENTIN 300 MG PO CAPS
300.0000 mg | ORAL_CAPSULE | Freq: Three times a day (TID) | ORAL | 0 refills | Status: AC
  Filled 2022-12-31: qty 60, 20d supply, fill #0

## 2022-12-31 MED ORDER — GABAPENTIN 300 MG PO CAPS
300.0000 mg | ORAL_CAPSULE | Freq: Three times a day (TID) | ORAL | Status: DC
  Administered 2022-12-31 (×2): 300 mg via ORAL
  Filled 2022-12-31 (×2): qty 1

## 2022-12-31 MED ORDER — OXYCODONE HCL 5 MG PO TABS: 5.0000 mg | ORAL_TABLET | ORAL | Status: DC | PRN

## 2022-12-31 NOTE — Progress Notes (Signed)
OT Cancellation Note  Patient Details Name: Deborah Duncan MRN: [id number] DOB: [birth year]   Cancelled Treatment:    Reason Eval/Treat Not Completed: Patient declined, no reason specified (Pt reporting she just fell asleep and needs to rest. Provided education and encouragement, pt continued to decline. OT evaluation to f/u as pt allows)  Elliot Cousin 12/31/2022, 11:37 AM
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
## 2022-12-31 NOTE — Progress Notes (Addendum)
PT Cancellation Note  Patient Details Name: Deborah Duncan MRN: [id number] DOB: 1996-04-14   Cancelled Treatment:    Reason Eval/Treat Not Completed: Fatigue/lethargy limiting ability to participate - checked with pt at 1610, pt states she is awake now but is "moving around fine" and will have help at home. PT to sign off.   Stacie Glaze, PT DPT Acute Rehabilitation Services Pager [phone]  Office [phone]  Louis Matte 12/31/2022, 3:52 PM
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
## 2022-12-31 NOTE — Plan of Care (Incomplete)
Pt A&OX4.

## 2023-01-01 ENCOUNTER — Encounter (HOSPITAL_COMMUNITY): Payer: Self-pay

## 2023-01-03 ENCOUNTER — Ambulatory Visit: Payer: Self-pay
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
## 2023-01-08 ENCOUNTER — Ambulatory Visit: Payer: Self-pay | Admitting: Student

## 2023-01-31 ENCOUNTER — Other Ambulatory Visit: Payer: Self-pay

## 2023-01-31 ENCOUNTER — Observation Stay (HOSPITAL_BASED_OUTPATIENT_CLINIC_OR_DEPARTMENT_OTHER): Payer: Self-pay

## 2023-01-31 ENCOUNTER — Emergency Department (HOSPITAL_COMMUNITY)
Admission: EM | Admit: 2023-01-31 | Discharge: 2023-01-31 | Disposition: A | Discharge status: Home / Self Care | Payer: Self-pay | Attending: Emergency Medicine | Admitting: Emergency Medicine

## 2023-01-31 ENCOUNTER — Encounter (HOSPITAL_COMMUNITY): Payer: Self-pay

## 2023-01-31 DIAGNOSIS — R52 Pain, unspecified: Secondary | ICD-10-CM

## 2023-01-31 DIAGNOSIS — M7989 Other specified soft tissue disorders: Secondary | ICD-10-CM

## 2023-01-31 DIAGNOSIS — R2232 Localized swelling, mass and lump, left upper limb: Secondary | ICD-10-CM | POA: Insufficient documentation

## 2023-01-31 NOTE — ED Notes (Signed)
Pt transported to vascular.  °

## 2023-01-31 NOTE — Discharge Instructions (Signed)
You were seen in the emergency department today for swelling in your left arm.  You do not have a clot in your left arm.  Please call Dr. Stann Mainland office today who is the orthopedic surgeon that saw you while you are in the hospital.  You were to have follow-up with him when you were discharged and it does not appear that this has been done yet.  Please return to the emergency department for significantly worsening swelling or pain.
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
## 2023-01-31 NOTE — Progress Notes (Signed)
VASCULAR LAB    Left upper extremity venous duplex has been performed.  See CV proc for preliminary results.  Relayed results to Theodis Blaze, PA-C  Trenesha Alcaide, RVT 01/31/2023, 11:21 AM
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
